# Patient Record
Sex: Female | Born: 1965 | Race: White | Hispanic: No | Marital: Married | Smoking: Current every day smoker
Health system: Southern US, Community
[De-identification: ages and names within clinical notes are randomized; demographics above are authoritative.]

## PROBLEM LIST (undated history)

## (undated) DIAGNOSIS — E669 Obesity, unspecified: Secondary | ICD-10-CM

## (undated) DIAGNOSIS — I10 Essential (primary) hypertension: Secondary | ICD-10-CM

## (undated) DIAGNOSIS — E119 Type 2 diabetes mellitus without complications: Secondary | ICD-10-CM

## (undated) DIAGNOSIS — E7800 Pure hypercholesterolemia, unspecified: Secondary | ICD-10-CM

## (undated) HISTORY — DX: Type 2 diabetes mellitus without complications: E11.9

## (undated) HISTORY — PX: WISDOM TOOTH EXTRACTION: SHX21

## (undated) HISTORY — DX: Pure hypercholesterolemia, unspecified: E78.00

## (undated) HISTORY — DX: Obesity, unspecified: E66.9

## (undated) HISTORY — DX: Essential (primary) hypertension: I10

---

## 2011-01-29 ENCOUNTER — Ambulatory Visit (INDEPENDENT_AMBULATORY_CARE_PROVIDER_SITE_OTHER): Payer: BC Managed Care – PPO | Admitting: Family Medicine

## 2011-01-29 ENCOUNTER — Encounter: Payer: Self-pay | Admitting: Family Medicine

## 2011-01-29 DIAGNOSIS — I10 Essential (primary) hypertension: Secondary | ICD-10-CM

## 2011-01-29 DIAGNOSIS — E785 Hyperlipidemia, unspecified: Secondary | ICD-10-CM

## 2011-01-29 DIAGNOSIS — E119 Type 2 diabetes mellitus without complications: Secondary | ICD-10-CM

## 2011-01-29 LAB — CONVERTED CEMR LAB: Hgb A1c MFr Bld: 7.3 %

## 2011-01-30 LAB — CONVERTED CEMR LAB
ALT: 16 units/L (ref 0–35)
AST: 15 units/L (ref 0–37)
CO2: 24 meq/L (ref 19–32)
Cholesterol: 163 mg/dL (ref 0–200)
Creatinine, Ser: 0.61 mg/dL (ref 0.40–1.20)
LDL Cholesterol: 75 mg/dL (ref 0–99)
Sodium: 140 meq/L (ref 135–145)
TSH: 0.635 microintl units/mL (ref 0.350–4.500)
Total Bilirubin: 0.3 mg/dL (ref 0.3–1.2)
Total CHOL/HDL Ratio: 4
Total Protein: 7.4 g/dL (ref 6.0–8.3)
VLDL: 47 mg/dL — ABNORMAL HIGH (ref 0–40)

## 2011-01-31 ENCOUNTER — Other Ambulatory Visit: Payer: Self-pay | Admitting: Family Medicine

## 2011-01-31 DIAGNOSIS — Z1231 Encounter for screening mammogram for malignant neoplasm of breast: Secondary | ICD-10-CM

## 2011-02-05 NOTE — Assessment & Plan Note (Signed)
Summary: NOV:DM, HTN, lipids   Vital Signs:  Patient profile:   45 year old female Height:      68 inches Weight:      213 pounds Pulse rate:   79 / minute BP sitting:   123 / 71  (right arm) Cuff size:   regular  Vitals Entered By: Avon Gully CMA, Duncan Dull) (January 29, 2011 9:26 AM) CC: NP-est care   CC:  NP-est care.  History of Present Illness: Hx of gestational DM.  Fromally dx with DM 8 years agoo. On metformin.  Last A1C was a year ago, it was 7.1.  NO regular exercise.    Habits & Providers  Alcohol-Tobacco-Diet     Alcohol drinks/day: <1     Tobacco Status: current     Cigarette Packs/Day: 0.5  Exercise-Depression-Behavior     Does Patient Exercise: no     STD Risk: never     Drug Use: no     Seat Belt Use: always  Current Medications (verified): 1)  Lisinopril 20 Mg Tabs (Lisinopril) .... Take One Tablet By Mouth Once A  Day 2)  Metformin Hcl 1000 Mg Tabs (Metformin Hcl) .... Take One Tablet By Mouth Once A Day 3)  Simvastatin 40 Mg Tabs (Simvastatin) .... Take One Tablet By Mouht Once Daily  Allergies (verified): No Known Drug Allergies  Comments:  Nurse/Medical Assistant: The patient's medications and allergies were reviewed with the patient and were updated in the Medication and Allergy Lists. Avon Gully CMA, Duncan Dull) (January 29, 2011 9:28 AM)  Past History:  Past Surgical History: None  Family History: Mother wtih Colon cancer (dx age 56), hi cho, HTN.  MGM with Colon Cancer (in her 81s?)  Social History: Current Smoker. Married to Psychologist, educational. 2 kids.  Alcohol use-yes Drug use-no Regular exercise-no 2-3 caffeinated drinks per day. Smoking Status:  current Packs/Day:  0.5 Does Patient Exercise:  no STD Risk:  never Drug Use:  no Seat Belt Use:  always  Review of Systems       No fever/sweats/weakness, unexplained weight loss/gain.  No vison changes.  No difficulty hearing/ringing in ears, hay fever/allergies.  No chest  pain/discomfort, palpitations.  No Br lump/nipple discharge.  No cough/wheeze.  No blood in BM, nausea/vomiting/diarrhea.  No nighttime urination, leaking urine, unusual vaginal bleeding, discharge (penis or vagina).  No muscle/joint pain. No rash, change in mole.  No HA, memory loss.  No anxiety, sleep d/o, depression.  No easy bruising/bleeding, unexplained lump   Physical Exam  General:  Well-developed,well-nourished,in no acute distress; alert,appropriate and cooperative throughout examination Head:  Normocephalic and atraumatic without obvious abnormalities. No apparent alopecia or balding. Eyes:  No corneal or conjunctival inflammation noted. EOMI. Perrla.  Neck:  No deformities, masses, or tenderness noted. NO TM.  Lungs:  Normal respiratory effort, chest expands symmetrically. Lungs are clear to auscultation, no crackles or wheezes. Heart:  Normal rate and regular rhythm. S1 and S2 normal without gallop, murmur, click, rub or other extra sounds.no carotid or abdominal bruits.  Abdomen:  Bowel sounds positive,abdomen soft and non-tender without masses, organomegaly or hernias noted. Pulses:  Radial 2+  Extremities:  NO LE edema.  Skin:  no rashes.   Cervical Nodes:  No lymphadenopathy noted Psych:  Cognition and judgment appear intact. Alert and cooperative with normal attention span and concentration. No apparent delusions, illusions, hallucinations   Impression & Recommendations:  Problem # 1:  DIABETES MELLITUS (ICD-250.00)  A1C today 7.3. Not well controlled. Add onglyza  to her regimen Starte regular exercise routine and work on weight loss and diet.   Due for labs and micro. She is on her period so will do micro at next visit. Her pneumo is up to date but not sure about her tetansus. Says she would like to wait until we get records.   follow-up in 3 months. Her updated medication list for this problem includes:    Lisinopril 20 Mg Tabs (Lisinopril) .Marland Kitchen... Take one tablet by  mouth once a  day    Kombiglyze Xr 2.04-999 Mg Xr24h-tab (Saxagliptin-metformin) .Marland Kitchen... Take 1 tablet by mouth once a day  Orders: T-Comprehensive Metabolic Panel [id number]) T-TSH [id number]) T-Lipid Profile [id number]) Fingerstick (36416) Hemoglobin A1C (29528)  Problem # 2:  HYPERTENSION, BENIGN (ICD-401.1)  Looks great! Well controlled.  Due to check CR. Her updated medication list for this problem includes:    Lisinopril 20 Mg Tabs (Lisinopril) .Marland Kitchen... Take one tablet by mouth once a  day  Orders: T-Comprehensive Metabolic Panel [id number]) T-TSH [phone]) T-Lipid Profile ([id number])  Problem # 3:  HYPERLIPIDEMIA (ICD-272.4)  Due to recheck to make sure at goal. Adjust meds if needed.  Her updated medication list for this problem includes:    Simvastatin 40 Mg Tabs (Simvastatin) .Marland Kitchen... Take one tablet by mouht once daily  Orders: T-Lipid Profile ([id number])  Complete Medication List: 1)  Lisinopril 20 Mg Tabs (Lisinopril) .... Take one tablet by mouth once a  day 2)  Kombiglyze Xr 2.04-999 Mg Xr24h-tab (Saxagliptin-metformin) .... Take 1 tablet by mouth once a day 3)  Simvastatin 40 Mg Tabs (Simvastatin) .... Take one tablet by mouht once daily  Other Orders: T-Mammography Bilateral Screening (88416)  Patient Instructions: 1)  Start Kombiglyze once a day 2)  Please schedule a follow-up appointment in 3 months for diabetes . 3)  We will call you with your lab results.    Contraindications/Deferment of Procedures/Staging:    Test/Procedure: FLU VAX    Reason for deferment: patient declined  Prescriptions: LISINOPRIL 20 MG TABS (LISINOPRIL) take one tablet by mouth once a  day  #90 x 3   Entered and Authorized by:   Nani Gasser MD   Signed by:   Nani Gasser MD on 01/29/2011   Method used:   Electronically to        [facility]* (retail)             ,          Ph: [phone]       Fax: [phone]   RxID:    [id number] KOMBIGLYZE XR 2.04-999 MG XR24H-TAB (SAXAGLIPTIN-METFORMIN) Take 1 tablet by mouth once a day  #30 x 2   Entered and Authorized by:   Nani Gasser MD   Signed by:   Nani Gasser MD on 01/29/2011   Method used:   Electronically to        [facility] [id number]* (retail)       [address]       [place], [place]  [address]       Ph: [phone]       Fax: [phone]   RxID:   [id number]    Orders Added: 1)  New Patient Level III [38101] 2)  T-Mammography Bilateral Screening [77057] 3)  T-Comprehensive Metabolic Panel [[id number]] 4)  T-TSH [75102-58527] 5)  T-Lipid Profile [80061-22930] 6)  Fingerstick [36416] 7)  Hemoglobin A1C [83036]   Immunization History:  Pneumovax Immunization History:    Pneumovax:  historical (11/14/2009)  Immunization History:  Pneumovax Immunization History:    Pneumovax:  Historical (11/14/2009)   Laboratory Results   Blood Tests   Date/Time Received: 01/29/11 Date/Time Reported: 01/29/11  HGBA1C: 7.3%   (Normal Range: Non-Diabetic - 3-6%   Control Diabetic - 6-8%)

## 2011-02-12 ENCOUNTER — Encounter: Payer: Self-pay | Admitting: Obstetrics & Gynecology

## 2011-02-12 ENCOUNTER — Other Ambulatory Visit: Payer: Self-pay | Admitting: Obstetrics & Gynecology

## 2011-02-12 ENCOUNTER — Ambulatory Visit
Admission: RE | Admit: 2011-02-12 | Discharge: 2011-02-12 | Disposition: A | Discharge status: Home / Self Care | Payer: BC Managed Care – PPO | Source: Ambulatory Visit | Attending: Obstetrics & Gynecology | Admitting: Obstetrics & Gynecology

## 2011-02-12 ENCOUNTER — Encounter: Payer: BC Managed Care – PPO | Admitting: Obstetrics & Gynecology

## 2011-02-12 DIAGNOSIS — N852 Hypertrophy of uterus: Secondary | ICD-10-CM

## 2011-02-12 DIAGNOSIS — Z01419 Encounter for gynecological examination (general) (routine) without abnormal findings: Secondary | ICD-10-CM

## 2011-02-12 DIAGNOSIS — Z1272 Encounter for screening for malignant neoplasm of vagina: Secondary | ICD-10-CM

## 2011-02-12 LAB — CONVERTED CEMR LAB
HCT: 30 % — ABNORMAL LOW (ref 36.0–46.0)
Hemoglobin: 9.7 g/dL — ABNORMAL LOW (ref 12.0–15.0)
MCHC: 32.5 g/dL (ref 30.0–36.0)
MCV: 73.5 fL — ABNORMAL LOW (ref 78.0–100.0)
RBC: 4.08 M/uL (ref 3.87–5.11)
RDW: 17.6 % — ABNORMAL HIGH (ref 11.5–15.5)

## 2011-02-17 ENCOUNTER — Ambulatory Visit: Payer: BC Managed Care – PPO

## 2011-02-18 ENCOUNTER — Other Ambulatory Visit: Payer: Self-pay | Admitting: Obstetrics & Gynecology

## 2011-02-18 ENCOUNTER — Ambulatory Visit: Payer: BC Managed Care – PPO | Admitting: Obstetrics & Gynecology

## 2011-02-18 ENCOUNTER — Ambulatory Visit
Admission: RE | Admit: 2011-02-18 | Discharge: 2011-02-18 | Disposition: A | Discharge status: Home / Self Care | Payer: BC Managed Care – PPO | Source: Ambulatory Visit | Attending: Family Medicine | Admitting: Family Medicine

## 2011-02-18 DIAGNOSIS — N938 Other specified abnormal uterine and vaginal bleeding: Secondary | ICD-10-CM

## 2011-02-18 DIAGNOSIS — D649 Anemia, unspecified: Secondary | ICD-10-CM

## 2011-02-18 DIAGNOSIS — N949 Unspecified condition associated with female genital organs and menstrual cycle: Secondary | ICD-10-CM

## 2011-02-18 DIAGNOSIS — Z1231 Encounter for screening mammogram for malignant neoplasm of breast: Secondary | ICD-10-CM

## 2011-02-18 DIAGNOSIS — D259 Leiomyoma of uterus, unspecified: Secondary | ICD-10-CM

## 2011-02-25 NOTE — Letter (Signed)
Summary: Intake Forms  Intake Forms   Imported By: Lanelle Bal 02/17/2011 10:55:02  _____________________________________________________________________  External Attachment:    Type:   Image     Comment:   External Document

## 2011-03-04 ENCOUNTER — Ambulatory Visit: Payer: BC Managed Care – PPO | Admitting: Obstetrics & Gynecology

## 2011-03-04 DIAGNOSIS — D259 Leiomyoma of uterus, unspecified: Secondary | ICD-10-CM

## 2011-03-06 ENCOUNTER — Other Ambulatory Visit: Payer: Self-pay | Admitting: Family Medicine

## 2011-03-07 NOTE — Assessment & Plan Note (Signed)
NAME:  ANALILIA, GEDDIS                 ACCOUNT NO.:  [id number]  MEDICAL RECORD NO.:  [id number]           PATIENT TYPE:  LOCATION:  [facility]           FACILITY:  PHYSICIAN:  Allie Bossier, MD        DATE OF BIRTH:  08-11-1966  DATE OF SERVICE:  03/04/2011                                 CLINIC NOTE  Ms. Phoebe Sharps is a 45 year old lady who is known to be anemic with a hemoglobin of 9.7 due to menorrhagia and ultrasound shows a submucosal fibroid measuring 4.1 cm with normal ovaries.  I did an endometrial biopsy earlier this month and the pathology has come back normal.  I have talked to her about Depot Lupron as a temporary measure versus robotic hysterectomy.  At this time, she would like to do watchful waiting (she says she has 2400 dollar deductible and she is not prepared to spend that money at this time).  She has started taking iron pills and she will come back in a year for her annual exam or sooner if she deems it necessary.     Allie Bossier, MD    MCD/MEDQ  D:  03/04/2011  T:  03/05/2011  Job:  [id number]

## 2011-03-07 NOTE — Assessment & Plan Note (Signed)
NAME:  BERDENA, CISEK                 ACCOUNT NO.:  [id number]  MEDICAL RECORD NO.:  [id number]           PATIENT TYPE:  LOCATION:  [facility]           FACILITY:  PHYSICIAN:  Allie Bossier, MD        DATE OF BIRTH:  1966-05-18  DATE OF SERVICE:  02/18/2011                                 CLINIC NOTE  HISTORY OF PRESENT ILLNESS:  Ms. Georgiades is a 45 year old married white G3, P2, A1 who was seen for her annual exam last week.  At that time, she was complaining that her periods are very heavy and last 8 days a month.  On exam, her uterus felt enlarged.  I did a CBC which showed her hemoglobin to be 9.7, and I ordered a GYN ultrasound that showed a submucosal fibroid measuring 4.3 cm.  She has another smaller fibroid elsewhere in the uterus.  The ovaries appeared normal.  The ultrasound also showed her uterine lining to be "upper limit of normal for premenopausal patient."  I therefore explained my desire to do a endometrial biopsy to sample the uterine lining.  She was consented, all questions were answered.  A pregnancy test was negative.  Her cervix was prepped with iodine.  The posterior lip of the cervix was grasped with a single-tooth tenaculum, and a Pipelle biopsy was used to obtain a sample of the uterine lining, a large amount of tissue was obtained.  She tolerated the procedure well, 800 of Motrin was given to her orally after the procedure.  ASSESSMENT/PLAN:  Submucosal fibroid (4.3 cm), dysfunctional uterine bleeding and resulting anemia.  Assuming the endometrial biopsy is within normal limits, I have offered her watchful waiting, robotic hysterectomy, and/or Depo-Lupron.  She has been given information on these options and she will come back in 1-2 weeks for the results of the biopsy as well as discussion of her treatment plan.     Allie Bossier, MD    MCD/MEDQ  D:  02/18/2011  T:  02/19/2011  Job:  [id number]

## 2011-03-07 NOTE — Assessment & Plan Note (Signed)
NAME:  Courtney Hart, Courtney Hart                 ACCOUNT NO.:  [id number]  MEDICAL RECORD NO.:  [id number]           PATIENT TYPE:  LOCATION:  [facility]           FACILITY:  PHYSICIAN:  Allie Bossier, MD             DATE OF BIRTH:  DATE OF SERVICE:  02/12/2011                                 CLINIC NOTE  HISTORY:  The patient is a 45 year old married, white, G3, P2, A1 with 2 living daughters ages 64 and 80.  She comes in here for annual exam.  Her only complaint is that of her breast being itchy at times.  She says her periods last 8 days a month and are extremely heavy for the first 2 days.  PAST MEDICAL HISTORY:  She was told that she had fibroids during her pregnancy.  She is hypertensive, obesity, and adult onset diabetes.  MEDICATIONS: 1. Lisinopril 20 mg daily. 2. Metformin 1000 mg daily.  SURGERY:  She had an elective AB in the form of a D and C.  She has had two C-sections and postpartum sterilization.  ALLERGIES:  No latex allergies.  NO KNOWN DRUG ALLERGIES.  SOCIAL HISTORY:  She reports a half-a-pack a day smoking for the last 20 years and no alcohol.  FAMILY HISTORY:  Positive for colon cancer in her mother diagnosed at age 71 and her maternal grandmother diagnosed before age 62.  REVIEW OF SYSTEMS:  She rarely has intercourse just because she has been fatigued and little anxious, low libido.  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  She had been married for last 17 years.  Mammogram is scheduled for next week and her Pap smear will be done today.  PHYSICAL EXAMINATION:  GENERAL:  Well-nourished, well-hydrated very pleasant white female, height 5'8'', weight 215 pounds, blood pressure 135/80, pulse 96. HEENT:  Normal. HEART:  Regular rate and rhythm. LUNGS:  Clear to auscultation bilaterally. ABDOMEN:  Obese.  She does have a yeast infection beneath her pannus and she uses [employer] on that. EXTERNAL GENITALIA:  No lesions.  She  does have a couple varicosities on her vulva.  Her uterus is well estrogenized.  Her cervix appears normal and nulliparous and a Pap smear was obtained, but please note that the os is pointed posteriorly due to, I believe, fibroids of the uterus.  On bimanual exam, her rectus muscles are very tight and her exam is limited by her centripetal morbid obesity; however, I do feel that her uterus is enlarged, I cannot palpate her ovaries.  ASSESSMENT/PLAN:  Annual exam.  I have checked Pap smear.  Recommended self-breast and self-vulvar exams monthly, and an annual mammogram. For general health maintenance, recommended that she lose weight as has her family doctor and stop smoking.  With regard to her heavy periods that last 8 days and the suspected uterine enlargement on physical exam,  I am ordering a GYN ultrasound as well as a CBC.  I will see her back when these results are available.     Allie Bossier, MD    MCD/MEDQ  D:  02/12/2011  T:  02/12/2011  Job:  [id number] 

## 2011-04-18 ENCOUNTER — Encounter: Payer: Self-pay | Admitting: Family Medicine

## 2011-04-30 ENCOUNTER — Ambulatory Visit: Payer: BC Managed Care – PPO | Admitting: Family Medicine

## 2011-12-09 ENCOUNTER — Other Ambulatory Visit: Payer: Self-pay | Admitting: Family Medicine

## 2012-03-08 ENCOUNTER — Other Ambulatory Visit: Payer: Self-pay | Admitting: Family Medicine

## 2012-03-08 NOTE — Telephone Encounter (Signed)
Must make appointment 

## 2012-03-10 ENCOUNTER — Other Ambulatory Visit: Payer: Self-pay | Admitting: Family Medicine

## 2012-03-10 DIAGNOSIS — Z1231 Encounter for screening mammogram for malignant neoplasm of breast: Secondary | ICD-10-CM

## 2012-03-17 IMAGING — US US TRANSVAGINAL NON-OB
1 series · 13 of 25 positions shown · non-contrast
Comparison: None.

CLINICAL DATA: The uterine enlargement on physical exam.  The LMP
01/26/2011



[Series 1: us transvaginal non-ob · 0.30mm/px · 13 of 58 slices shown]
[im 1/58]
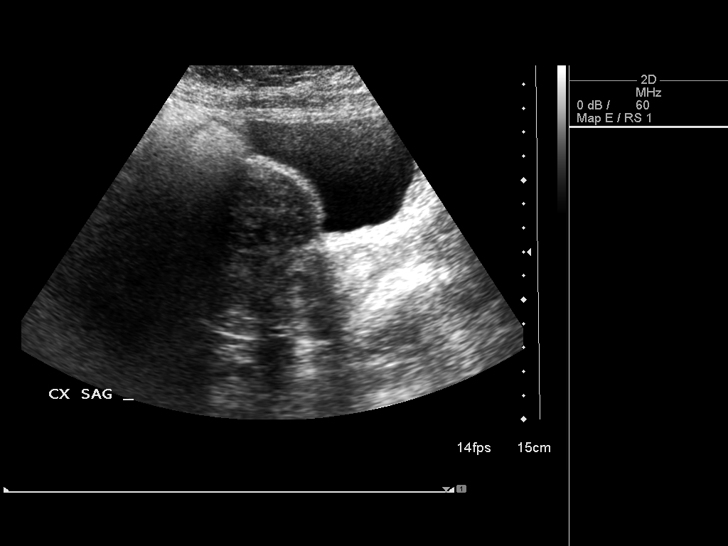
[im 5/58]
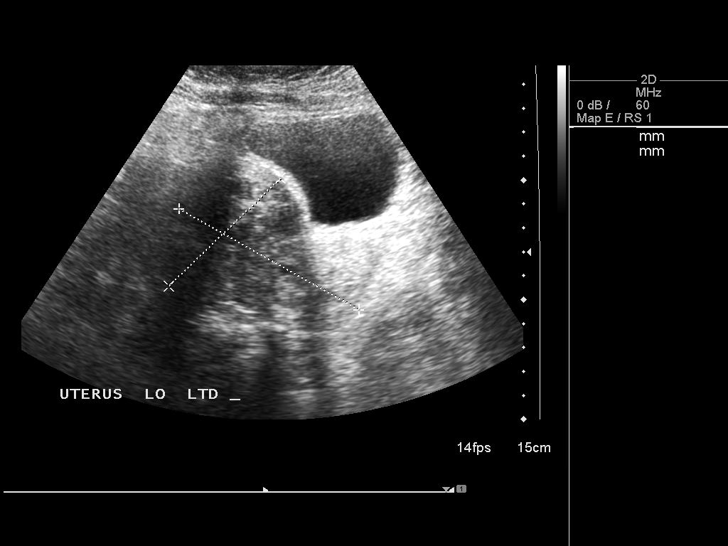
[im 10/58]
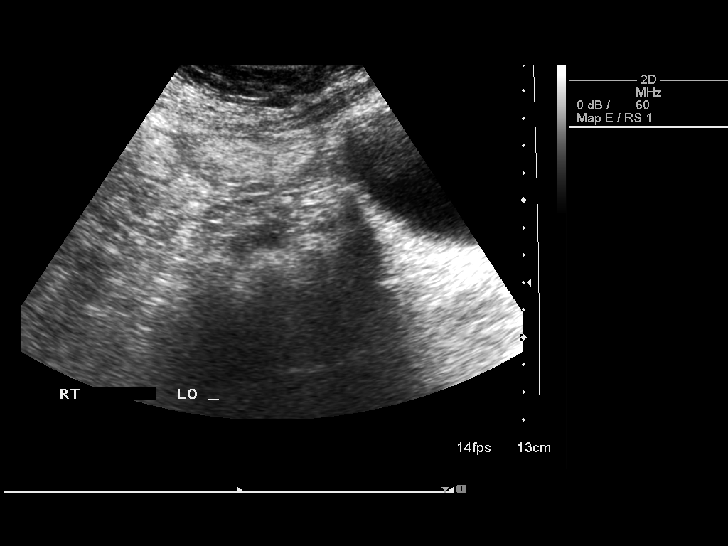
[im 15/58]
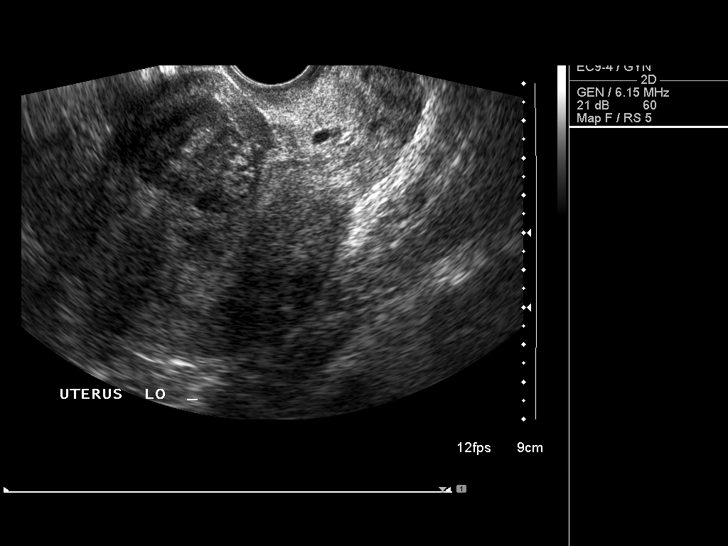
[im 20/58]
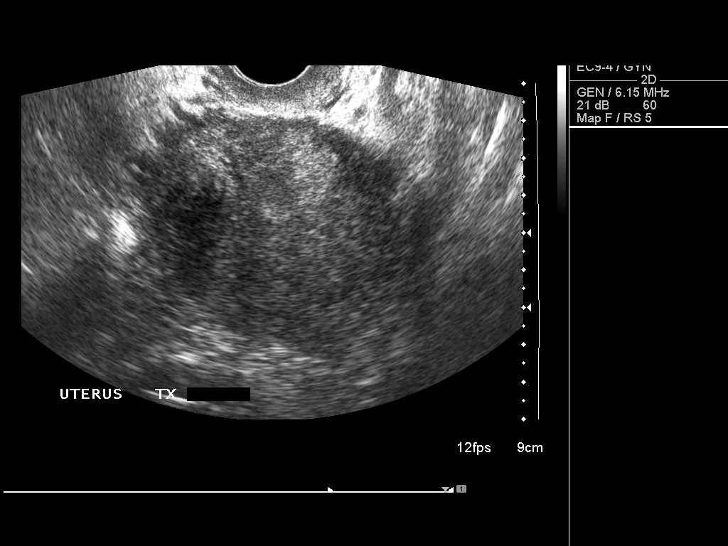
[im 24/58]
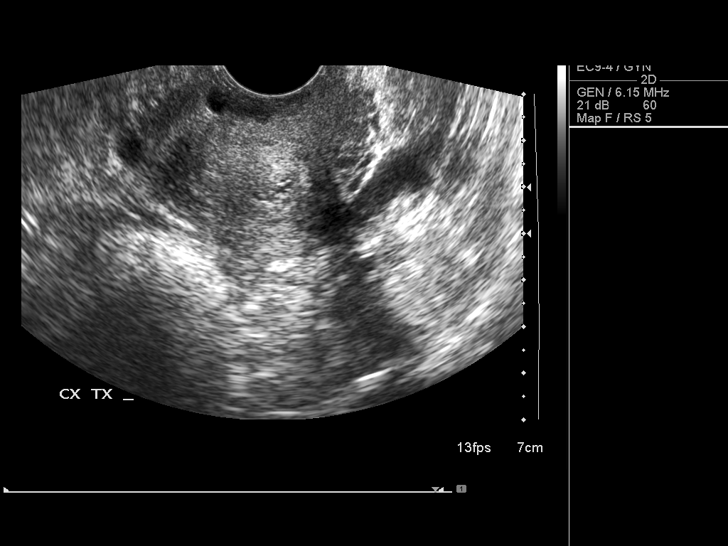
[im 29/58]
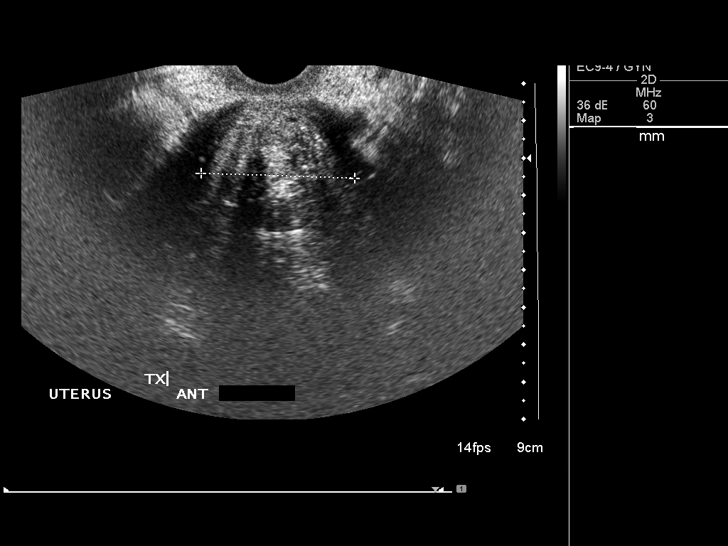
[im 34/58]
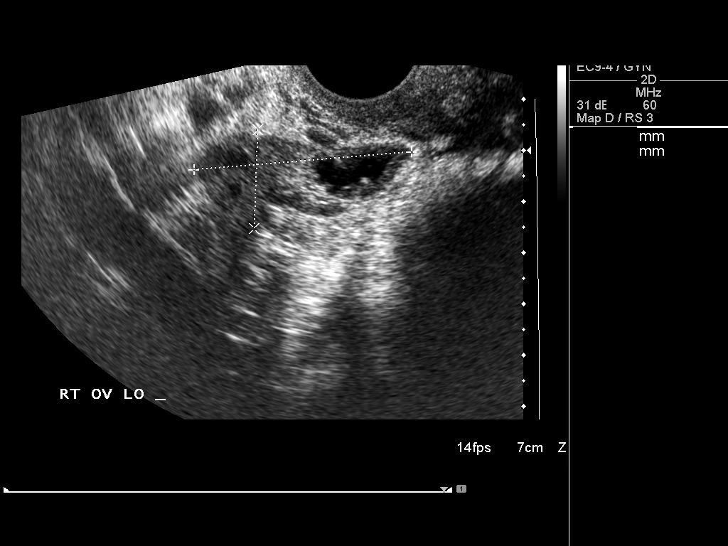
[im 39/58]
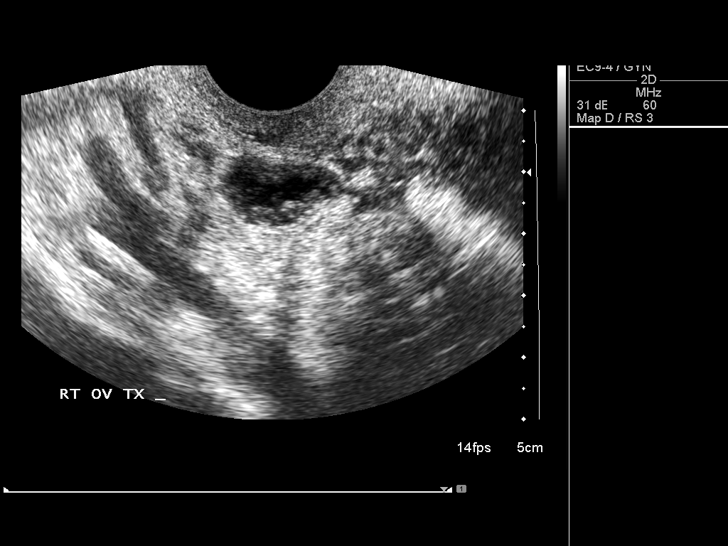
[im 43/58]
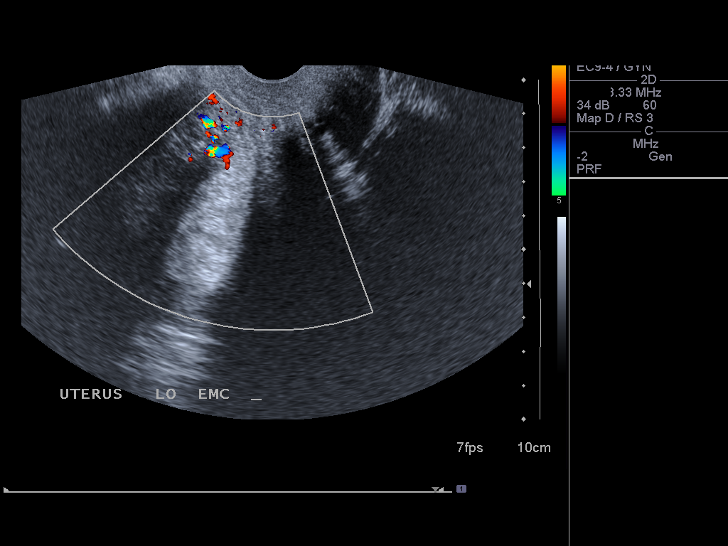
[im 48/58]
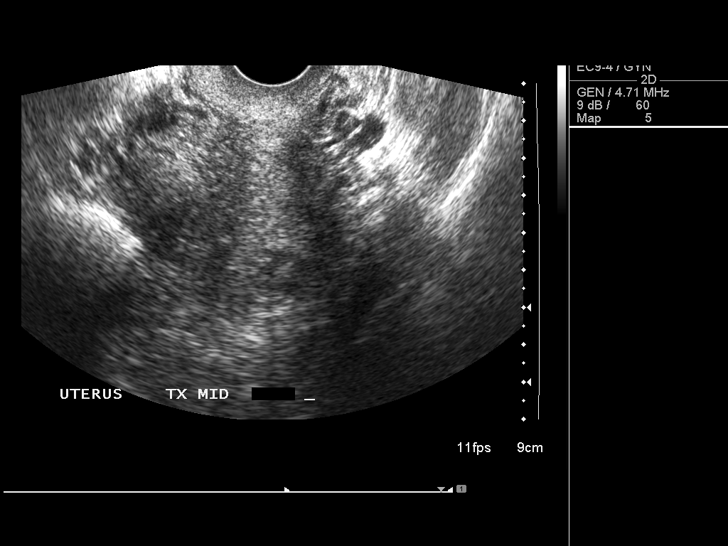
[im 53/58]
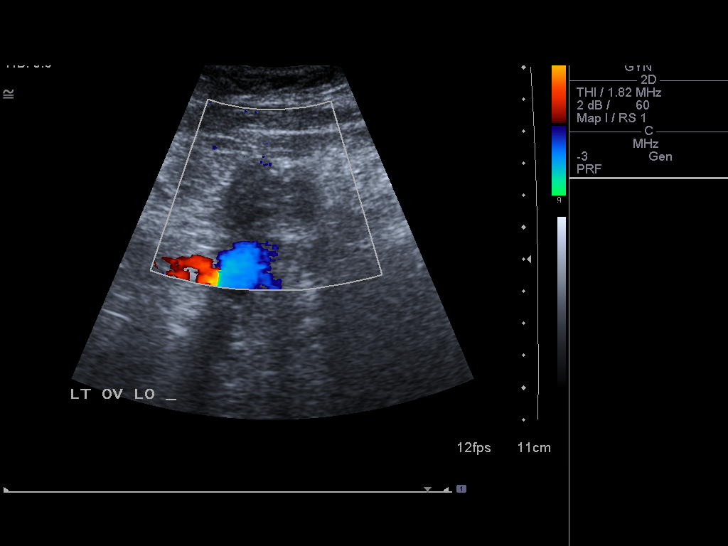
[im 58/58]
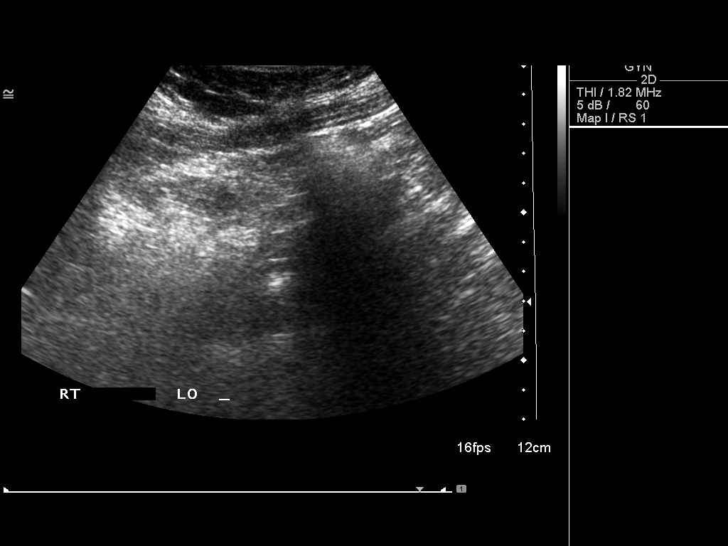

[13 of 25 positions shown; findings below may reference images not displayed]

FINDINGS: Uterus measures 10.8 x 8.1 x 6.7 cm.  The myometrium is diffusely
heterogeneous.  An anterior uterine body fibroid in the midline is
intramural with a subserosal component and  measures 4.2 x 3.7 x
4.1 cm.  In the posterior uterine body in the midline is an
intramural 1.8 x 1.6 x 1.7 cm fibroid.

Endometrium measures 1.7 cm at the uterine fundus.  Portions of the
endometrium are obscured by the by shadowing from the fibroid.

Right Ovary measures 4.2 x 1.9 x 2.0 cm and has normal sonographic
appearances and color Doppler flow.

Left Ovary measures 3.1 x 2.4 x 2.1 cm approximately, and is best
seen with transabdominal imaging.  Color Doppler flow is identified
to the left transabdominal imaging.  No cyst or mass is identified.

Other Findings:  Trace amount of free pelvic fluid is seen.
IMPRESSION: 1. The uterus is enlarged and heterogeneous and contains at least
two focal fibroids, the largest measuring 4.3 cm in the anterior
uterine body.
2.  Endometrial thickness is upper normal for a premenopausal
patient.  If the patient is having abnormal uterine bleeding, a
follow-up pelvic ultrasound immediately following the menstrual
period could be performed.

## 2012-03-23 ENCOUNTER — Ambulatory Visit
Admission: RE | Admit: 2012-03-23 | Discharge: 2012-03-23 | Disposition: A | Discharge status: Home / Self Care | Payer: 59 | Source: Ambulatory Visit | Attending: Family Medicine | Admitting: Family Medicine

## 2012-03-23 DIAGNOSIS — Z1231 Encounter for screening mammogram for malignant neoplasm of breast: Secondary | ICD-10-CM

## 2012-06-06 ENCOUNTER — Other Ambulatory Visit: Payer: Self-pay | Admitting: Family Medicine

## 2013-05-18 ENCOUNTER — Other Ambulatory Visit: Payer: Self-pay | Admitting: Family Medicine

## 2013-05-18 DIAGNOSIS — Z1231 Encounter for screening mammogram for malignant neoplasm of breast: Secondary | ICD-10-CM

## 2013-05-19 ENCOUNTER — Ambulatory Visit (HOSPITAL_BASED_OUTPATIENT_CLINIC_OR_DEPARTMENT_OTHER)
Admission: RE | Admit: 2013-05-19 | Discharge: 2013-05-19 | Disposition: A | Discharge status: Home / Self Care | Payer: 59 | Source: Ambulatory Visit | Attending: Family Medicine | Admitting: Family Medicine

## 2013-05-19 DIAGNOSIS — Z1231 Encounter for screening mammogram for malignant neoplasm of breast: Secondary | ICD-10-CM | POA: Insufficient documentation

## 2013-05-24 ENCOUNTER — Ambulatory Visit: Payer: 59

## 2013-06-23 ENCOUNTER — Other Ambulatory Visit: Payer: Self-pay

## 2014-05-02 ENCOUNTER — Other Ambulatory Visit (HOSPITAL_BASED_OUTPATIENT_CLINIC_OR_DEPARTMENT_OTHER): Payer: Self-pay | Admitting: Physician Assistant

## 2014-05-02 DIAGNOSIS — Z1231 Encounter for screening mammogram for malignant neoplasm of breast: Secondary | ICD-10-CM

## 2014-05-26 ENCOUNTER — Ambulatory Visit (HOSPITAL_BASED_OUTPATIENT_CLINIC_OR_DEPARTMENT_OTHER)
Admission: RE | Admit: 2014-05-26 | Discharge: 2014-05-26 | Disposition: A | Discharge status: Home / Self Care | Payer: 59 | Source: Ambulatory Visit | Attending: Physician Assistant | Admitting: Physician Assistant

## 2014-05-26 DIAGNOSIS — Z1231 Encounter for screening mammogram for malignant neoplasm of breast: Secondary | ICD-10-CM | POA: Insufficient documentation

## 2014-05-26 DIAGNOSIS — Z803 Family history of malignant neoplasm of breast: Secondary | ICD-10-CM | POA: Insufficient documentation

## 2014-09-07 ENCOUNTER — Ambulatory Visit (INDEPENDENT_AMBULATORY_CARE_PROVIDER_SITE_OTHER): Payer: 59 | Admitting: Obstetrics & Gynecology

## 2014-09-07 ENCOUNTER — Encounter: Payer: Self-pay | Admitting: Obstetrics & Gynecology

## 2014-09-07 VITALS — BP 136/82 | HR 90 | Resp 16 | Ht 68.0 in | Wt 220.0 lb

## 2014-09-07 DIAGNOSIS — N951 Menopausal and female climacteric states: Secondary | ICD-10-CM | POA: Insufficient documentation

## 2014-09-07 DIAGNOSIS — Z124 Encounter for screening for malignant neoplasm of cervix: Secondary | ICD-10-CM

## 2014-09-07 DIAGNOSIS — Z01419 Encounter for gynecological examination (general) (routine) without abnormal findings: Secondary | ICD-10-CM

## 2014-09-07 DIAGNOSIS — Z1151 Encounter for screening for human papillomavirus (HPV): Secondary | ICD-10-CM

## 2014-09-07 NOTE — Progress Notes (Signed)
  Subjective:     Courtney Hart is a 48 y.o. female here for a routine exam.  Current complaints:  Feeling hot but no hot flashes.  Pt had monthly menses until this past year where she started dropping menses.  No long periods of menorrhagia.  Personal health questionnaire reviewed: yes.   Gynecologic History Patient's last menstrual period was 05/16/2014. Contraception: tubal ligation Last Pap: 2012. Results were: normal Last mammogram: 2015. Results were: normal  Obstetric History OB History  Gravida Para Term Preterm AB SAB TAB Ectopic Multiple Living  3 2 2  0 1 0 0 0 0 2    # Outcome Date GA Lbr Len/2nd Weight Sex Delivery Anes PTL Lv  3 ABT           2 TRM      CS     1 TRM      CS          The following portions of the patient's history were reviewed and updated as appropriate: allergies, current medications, past family history, past medical history, past social history, past surgical history and problem list.  Review of Systems Pertinent items are noted in HPI.    Objective:      Filed Vitals:   09/07/14 1011  BP: 136/82  Pulse: 90  Resp: 16  Height: 5\' 8"  (1.727 m)  Weight: 220 lb (99.791 kg)   Vitals:  WNL General appearance: alert, cooperative and no distress Head: Normocephalic, without obvious abnormality, atraumatic Eyes: negative Throat: lips, mucosa, and tongue normal; teeth and gums normal Lungs: clear to auscultation bilaterally Breasts: normal appearance, no masses or tenderness, No nipple retraction or dimpling, No nipple discharge or bleeding Heart: regular rate and rhythm Abdomen: soft, non-tender; bowel sounds normal; no masses,  no organomegaly  Pelvic:  External Genitalia:  Tanner V, no lesion Urethra Vagina:  Pale pink, normal rugae, small amt of bleeding on pap, no lesion Cervix:  No CMT, no lesion Uterus:  Normal size and contour, non tender, difficult secondary to habitus Adnexa:  Normal, no masses, non tender, difficult due to  habitus Rectovaginal Septum:  Non tender, no masses  Extremities: no edema, redness or tenderness in the calves or thighs Skin: resolving boil on right chest Lymph nodes: Axillary adenopathy: none         Assessment:    Healthy female exam.    Plan:    Contraception: tubal ligation. Mammogram ordered. Follow up in: 1 year. discussed menopuase  Continue yearly mammogram Colonoscopy at age 66. CA plus vit D

## 2014-09-07 NOTE — Patient Instructions (Signed)
Calcium Intake Recommendations Calcium in our blood is important for the control of many things, such as:  Blood clotting.  Conducting of nerve impulses.  Muscle contraction.  Maintaining teeth and bone health.  Other body functions. Age group / Amount of calcium to consume daily, in milligrams (mg)  Birth to 6 months / 200 mg  Infants 7 to 12 months / 260 mg  Children 1 to 3 years / 700 mg  Children 4 to 8 years / 1,000 mg  Children 9 to 13 years / 1,300 mg  Teens 14 to 18 years / 1,300 mg  Adults 19 to 50 years / 1,000 mg  Adult women 51 to 70 years / 1,200 mg  Adults 71 years and older / 1,200 mg  Pregnant and breastfeeding teens / 1,300 mg  Pregnant and breastfeeding adults / 1,000 mg Document Released: 07/15/2004 Document Revised: 03/28/2013 Document Reviewed: 12/01/2005 ExitCare Patient Information 2015 [place], [place]. This information is not intended to replace advice given to you by your health care provider. Make sure you discuss any questions you have with your health care provider.

## 2014-09-07 NOTE — Addendum Note (Signed)
Addended by: Gretchen Short on: 09/07/2014 10:52 AM   Modules accepted: Orders

## 2014-09-08 LAB — CYTOLOGY - PAP

## 2014-10-16 ENCOUNTER — Encounter: Payer: Self-pay | Admitting: Obstetrics & Gynecology

## 2015-06-29 ENCOUNTER — Other Ambulatory Visit: Payer: Self-pay | Admitting: Physician Assistant

## 2015-06-29 DIAGNOSIS — Z1231 Encounter for screening mammogram for malignant neoplasm of breast: Secondary | ICD-10-CM

## 2015-07-11 ENCOUNTER — Ambulatory Visit (INDEPENDENT_AMBULATORY_CARE_PROVIDER_SITE_OTHER): Payer: BLUE CROSS/BLUE SHIELD

## 2015-07-11 DIAGNOSIS — Z1231 Encounter for screening mammogram for malignant neoplasm of breast: Secondary | ICD-10-CM

## 2016-06-24 ENCOUNTER — Other Ambulatory Visit: Payer: Self-pay | Admitting: Physician Assistant

## 2016-06-24 ENCOUNTER — Other Ambulatory Visit: Payer: Self-pay | Admitting: *Deleted

## 2016-06-24 DIAGNOSIS — Z1231 Encounter for screening mammogram for malignant neoplasm of breast: Secondary | ICD-10-CM

## 2016-07-11 ENCOUNTER — Ambulatory Visit (INDEPENDENT_AMBULATORY_CARE_PROVIDER_SITE_OTHER): Payer: BLUE CROSS/BLUE SHIELD

## 2016-07-11 DIAGNOSIS — Z1231 Encounter for screening mammogram for malignant neoplasm of breast: Secondary | ICD-10-CM
